# Patient Record
Sex: Male | Born: 1990 | Race: Black or African American | Hispanic: No | Marital: Single | Smoking: Never smoker
Health system: Southern US, Community
[De-identification: ages and names within clinical notes are randomized; demographics above are authoritative.]

## PROBLEM LIST (undated history)

## (undated) DIAGNOSIS — Z8619 Personal history of other infectious and parasitic diseases: Secondary | ICD-10-CM

---
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
## 2009-12-20 ENCOUNTER — Emergency Department (HOSPITAL_COMMUNITY): Admission: EM | Admit: 2009-12-20 | Discharge: 2009-12-20 | Payer: Self-pay | Admitting: Emergency Medicine

## 2011-05-19 ENCOUNTER — Ambulatory Visit (HOSPITAL_COMMUNITY): Payer: Self-pay | Admitting: Licensed Clinical Social Worker

## 2012-03-14 ENCOUNTER — Emergency Department (HOSPITAL_COMMUNITY)
Admission: EM | Admit: 2012-03-14 | Discharge: 2012-03-14 | Disposition: A | Discharge status: Home / Self Care | Payer: BC Managed Care – PPO | Attending: Emergency Medicine | Admitting: Emergency Medicine

## 2012-03-14 DIAGNOSIS — L738 Other specified follicular disorders: Secondary | ICD-10-CM | POA: Insufficient documentation

## 2012-03-14 DIAGNOSIS — L739 Follicular disorder, unspecified: Secondary | ICD-10-CM

## 2012-03-14 MED ORDER — DOXYCYCLINE HYCLATE 100 MG PO CAPS: 100.0000 mg | ORAL_CAPSULE | Freq: Two times a day (BID) | ORAL | Status: AC

## 2012-03-14 MED ORDER — HYDROCODONE-ACETAMINOPHEN 5-325 MG PO TABS: 2.0000 | ORAL_TABLET | ORAL | Status: AC | PRN

## 2012-03-14 MED ORDER — AMOXICILLIN-POT CLAVULANATE 500-125 MG PO TABS: 1.0000 | ORAL_TABLET | Freq: Three times a day (TID) | ORAL | Status: AC

## 2012-03-14 MED ORDER — IBUPROFEN 600 MG PO TABS: 600.0000 mg | ORAL_TABLET | Freq: Four times a day (QID) | ORAL | Status: AC | PRN

## 2012-03-14 NOTE — ED Provider Notes (Signed)
History   This chart was scribed for Nelia Shi, MD by Sofie Rower. The patient was seen in room TR07C/TR07C and the patient's care was started at 2:52 PM   CSN: [id number]  Arrival date & time 03/14/12  1332   First MD Initiated Contact with Patient 03/14/12 1424      Chief Complaint  Patient presents with  . Facial Pain    (Consider location/radiation/quality/duration/timing/severity/associated sxs/prior treatment) Patient is a 21 y.o. male presenting with facial injury. The history is provided by the patient. No language interpreter was used.  Facial Injury  The incident occurred yesterday. The incident occurred at home. Injury mechanism: Pulling ingrown hairs with tweezers. Context: While removing ingrown hairs. The wounds were self-inflicted. He came to the ER via personal transport. The pain is moderate. It is unknown if a foreign body is present. There is no possibility that he inhaled smoke. Pertinent negatives include no numbness, no visual disturbance, no nausea, no vomiting, no headaches, no neck pain, no focal weakness, no tingling and no cough. There have been no prior injuries to these areas.    Jimmy Lucas is a 21 y.o. male who presents to the Emergency Department complaining of sudden, progressively worsening, facial pain located at the right side of the face , onset two days ago,  with associated symptoms of swelling and redness located at the right side of the face. The pt reports he pulled some hairs from the right side of his beard with tweezers two days ago and woke up this morning with swelling located at the right side of his face. 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    No past medical history on file.  No past surgical history on file.  No family history on file.  History  Substance Use Topics  . Smoking status: Not on file  . Smokeless tobacco: Not  on file  . Alcohol Use: Not on file      Review of Systems  HENT: Negative for neck pain.   Eyes: Negative for visual disturbance.  Respiratory: Negative for cough.   Gastrointestinal: Negative for nausea and vomiting.  Neurological: Negative for tingling, focal weakness, numbness and headaches.  All other systems reviewed and are negative.    Allergies  Review of patient's allergies indicates no known allergies.  Home Medications   Current Outpatient Rx  Name Route Sig Dispense Refill  . AMOXICILLIN-POT CLAVULANATE 500-125 MG PO TABS Oral Take 1 tablet (500 mg total) by mouth every 8 (eight) hours. 21 tablet 0  . HYDROCODONE-ACETAMINOPHEN 5-325 MG PO TABS Oral Take 2 tablets by mouth every 4 (four) hours as needed for pain. 6 tablet 0  . IBUPROFEN 600 MG PO TABS Oral Take 1 tablet (600 mg total) by mouth every 6 (six) hours as needed for pain. 30 tablet 0    BP 128/84  Pulse 65  Temp 97.1 F (36.2 C)  Resp 16  SpO2 100%  Physical Exam  Nursing note and vitals reviewed. Constitutional: He is oriented to person, place, and time. He appears well-developed. No distress.  HENT:  Head: Normocephalic and atraumatic.  Eyes: Pupils are equal, round, and reactive to light.  Neck: Normal range of motion.    Cardiovascular: Normal rate and intact distal pulses.   Pulmonary/Chest: No respiratory distress.  Abdominal: Normal appearance. He exhibits no distension.  Musculoskeletal:  Normal range of motion.  Neurological: He is alert and oriented to person, place, and time. No cranial nerve deficit.  Skin: Skin is warm and dry. No rash noted.  Psychiatric: He has a normal mood and affect. His behavior is normal.    ED Course  Procedures (including critical care time)  DIAGNOSTIC STUDIES: Oxygen Saturation is 100% on room air, normal by my interpretation.    COORDINATION OF CARE:    2:55PM- Skin infection and refraining from pulling anymore hairs from the beard  discussed.    Labs Reviewed - No data to display No results found.   1. Folliculitis       MDM        I personally performed the services described in this documentation, which was scribed in my presence. The recorded information has been reviewed and considered.    Nelia Shi, MD 03/14/12 1520

## 2012-03-14 NOTE — ED Notes (Signed)
States 2 days ago he pulled some hairs from rt side of mouth w/ tweezers and today woke up w/ swelling and reddness in that area states did clean skin before he did that

## 2013-04-16 ENCOUNTER — Emergency Department (HOSPITAL_COMMUNITY)
Admission: EM | Admit: 2013-04-16 | Discharge: 2013-04-16 | Disposition: A | Discharge status: Home / Self Care | Payer: BC Managed Care – PPO | Attending: Emergency Medicine | Admitting: Emergency Medicine

## 2013-04-16 ENCOUNTER — Encounter (HOSPITAL_COMMUNITY): Payer: Self-pay | Admitting: *Deleted

## 2013-04-16 DIAGNOSIS — Y929 Unspecified place or not applicable: Secondary | ICD-10-CM | POA: Insufficient documentation

## 2013-04-16 DIAGNOSIS — F17200 Nicotine dependence, unspecified, uncomplicated: Secondary | ICD-10-CM | POA: Insufficient documentation

## 2013-04-16 DIAGNOSIS — IMO0002 Reserved for concepts with insufficient information to code with codable children: Secondary | ICD-10-CM | POA: Insufficient documentation

## 2013-04-16 DIAGNOSIS — Y9389 Activity, other specified: Secondary | ICD-10-CM | POA: Insufficient documentation

## 2013-04-16 DIAGNOSIS — T161XXA Foreign body in right ear, initial encounter: Secondary | ICD-10-CM

## 2013-04-16 DIAGNOSIS — T169XXA Foreign body in ear, unspecified ear, initial encounter: Secondary | ICD-10-CM | POA: Insufficient documentation

## 2013-04-16 MED ORDER — LIDOCAINE HCL (PF) 1 % IJ SOLN
INTRAMUSCULAR | Status: AC
  Administered 2013-04-16: 5 mL via OTIC
  Filled 2013-04-16: qty 5

## 2013-04-16 MED ORDER — CIPROFLOXACIN-DEXAMETHASONE 0.3-0.1 % OT SUSP: 4.0000 [drp] | Freq: Two times a day (BID) | OTIC | Status: DC

## 2013-04-16 MED ORDER — CIPROFLOXACIN-DEXAMETHASONE 0.3-0.1 % OT SUSP
4.0000 [drp] | Freq: Two times a day (BID) | OTIC | Status: DC
  Administered 2013-04-16: 4 [drp] via OTIC
  Filled 2013-04-16 (×3): qty 7.5

## 2013-04-16 NOTE — ED Provider Notes (Signed)
CSN: [id number]     Arrival date & time 04/16/13  0038 History   First MD Initiated Contact with Patient 04/16/13 0320     Chief Complaint  Patient presents with  . Foreign Body in Ear   (Consider location/radiation/quality/duration/timing/severity/associated sxs/prior Treatment) HPI 22 year old male presents to emergency room with complaint of bug in right ear.  Patient reports he's been feeling insect crawl around.  He is tried to take it out without improvement. History reviewed. No pertinent past medical history. History reviewed. No pertinent past surgical history. History reviewed. No pertinent family history. History  Substance Use Topics  . Smoking status: Current Every Day Smoker  . Smokeless tobacco: Not on file  . Alcohol Use: Yes    Review of Systems  All other systems reviewed and are negative.    Allergies  Review of patient's allergies indicates no known allergies.  Home Medications   Current Outpatient Rx  Name  Route  Sig  Dispense  Refill  . ciprofloxacin-dexamethasone (CIPRODEX) otic suspension   Right Ear   Place 4 drops into the right ear 2 (two) times daily.   7.5 mL   0    BP 139/82  Pulse 114  Temp(Src) 98.1 F (36.7 C) (Oral)  Resp 20  SpO2 100% Physical Exam  HENT:  Left Ear: External ear normal.  Right ear without insect visualized.  Excoriation noted to external pinna.  He has irritation of the canal, and the TM.    ED Course  Procedures (including critical care time) Labs Review Labs Reviewed - No data to display Imaging Review No results found.  MDM   1. FB ear, right, initial encounter    22 year old male with reported blood in the ear.  It was visualized at triage, but has since left the ear canal.  Given the excoriation and inflammation of the ear canal, will start on Cipro otic.    Olivia Mackie, MD 04/16/13 [phone]

## 2013-04-16 NOTE — ED Notes (Signed)
Lidocaine placed in ear to assist in removing cockroach.

## 2013-04-16 NOTE — ED Notes (Signed)
Pt has bug in his right ear confirmed by looking in right ear.

## 2014-01-01 ENCOUNTER — Emergency Department (HOSPITAL_COMMUNITY)
Admission: EM | Admit: 2014-01-01 | Discharge: 2014-01-02 | Disposition: A | Discharge status: Home / Self Care | Payer: BC Managed Care – PPO | Attending: Emergency Medicine | Admitting: Emergency Medicine

## 2014-01-01 ENCOUNTER — Encounter (HOSPITAL_COMMUNITY): Payer: Self-pay | Admitting: Emergency Medicine

## 2014-01-01 ENCOUNTER — Emergency Department (HOSPITAL_COMMUNITY): Payer: BC Managed Care – PPO

## 2014-01-01 DIAGNOSIS — R55 Syncope and collapse: Secondary | ICD-10-CM | POA: Insufficient documentation

## 2014-01-01 DIAGNOSIS — R079 Chest pain, unspecified: Secondary | ICD-10-CM | POA: Insufficient documentation

## 2014-01-01 DIAGNOSIS — F17200 Nicotine dependence, unspecified, uncomplicated: Secondary | ICD-10-CM | POA: Insufficient documentation

## 2014-01-01 LAB — CBG MONITORING, ED: Glucose-Capillary: 103 mg/dL — ABNORMAL HIGH (ref 70–99)

## 2014-01-01 LAB — CBC
HEMATOCRIT: 42.2 % (ref 39.0–52.0)
Hemoglobin: 14.9 g/dL (ref 13.0–17.0)
MCH: 26.7 pg (ref 26.0–34.0)
MCHC: 35.3 g/dL (ref 30.0–36.0)
MCV: 75.6 fL — ABNORMAL LOW (ref 78.0–100.0)
PLATELETS: 186 10*3/uL (ref 150–400)
RBC: 5.58 MIL/uL (ref 4.22–5.81)
RDW: 13 % (ref 11.5–15.5)
WBC: 7.3 10*3/uL (ref 4.0–10.5)

## 2014-01-01 LAB — I-STAT CHEM 8, ED
BUN: 12 mg/dL (ref 6–23)
CALCIUM ION: 1.17 mmol/L (ref 1.12–1.23)
Chloride: 100 mEq/L (ref 96–112)
Creatinine, Ser: 0.9 mg/dL (ref 0.50–1.35)
GLUCOSE: 93 mg/dL (ref 70–99)
HCT: 48 % (ref 39.0–52.0)
Hemoglobin: 16.3 g/dL (ref 13.0–17.0)
Potassium: 3.5 mEq/L — ABNORMAL LOW (ref 3.7–5.3)
Sodium: 139 mEq/L (ref 137–147)
TCO2: 21 mmol/L (ref 0–100)

## 2014-01-01 NOTE — ED Notes (Signed)
Pt states after playing basketball for 20ish minutes this evening began having chest discomfort, sat down began having numbness to hands pt stood to walk to vehicle for water feels his legs became weak and lost consciousness. Denies injury with fall.

## 2014-01-02 NOTE — ED Provider Notes (Signed)
CSN: [id number]     Arrival date & time 01/01/14  2116 History   First MD Initiated Contact with Patient 01/01/14 2306     Chief Complaint  Patient presents with  . Loss of Consciousness      HPI Patient presents the emergency department because of approximately 20 minutes of chest discomfort while playing basketball this evening.  He has not played basketball a while.  He is playing outdoors in the heat.  He states he began playing shortly after awakening without eating or drinking anything.  After he developed a chest discomfort he then walked towards the [place] to get something to drink and had an episode of syncope.  He states he felt weak and lost consciousness and fell to the ground.  He has no complaints at this time.  Denies chest pain.  His symptoms also began initially after he felt weak and he sat down and began having numbness in both of his hands and he was when he stood to walk to the vehicle that he loss consciousness.  No family history of early cardiac death.  No active chest pain at this time.  Patient does smoke cigarettes.  No family history of early heart disease.   History reviewed. No pertinent past medical history. History reviewed. No pertinent past surgical history. No family history on file. History  Substance Use Topics  . Smoking status: Current Every Day Smoker  . Smokeless tobacco: Not on file  . Alcohol Use: Yes    Review of Systems  All other systems reviewed and are negative.     Allergies  Review of patient's allergies indicates no known allergies.  Home Medications   Prior to Admission medications   Medication Sig Start Date End Date Taking? Authorizing Provider  OVER THE COUNTER MEDICATION Apply 1 application topically as needed (for razor bumps). Kiti Kiti over the counter cream   Yes Historical Provider, MD   BP 127/82  Pulse 96  Temp(Src) 98.4 F (36.9 C) (Oral)  Resp 20  Ht 6' (1.829 m)  Wt 155 lb (70.308 kg)  BMI 21.02 kg/m2  SpO2  100% Physical Exam  Nursing note and vitals reviewed. Constitutional: He is oriented to person, place, and time. He appears well-developed and well-nourished.  HENT:  Head: Normocephalic and atraumatic.  Eyes: EOM are normal.  Neck: Normal range of motion.  Cardiovascular: Normal rate, regular rhythm, normal heart sounds and intact distal pulses.   No murmur heard. Pulmonary/Chest: Effort normal and breath sounds normal. No respiratory distress.  Abdominal: Soft. He exhibits no distension. There is no tenderness.  Musculoskeletal: Normal range of motion.  Neurological: He is alert and oriented to person, place, and time.  Skin: Skin is warm and dry.  Psychiatric: He has a normal mood and affect. Judgment normal.    ED Course  Procedures (including critical care time) Labs Review Labs Reviewed  CBC - Abnormal; Notable for the following:    MCV 75.6 (*)    All other components within normal limits  CBG MONITORING, ED - Abnormal; Notable for the following:    Glucose-Capillary 103 (*)    All other components within normal limits  I-STAT CHEM 8, ED - Abnormal; Notable for the following:    Potassium 3.5 (*)    All other components within normal limits  CBG MONITORING, ED    Imaging Review Dg Chest 2 View  01/02/2014   CLINICAL DATA:  Syncope.  Cigarette smoker.  Lost consciousness.  EXAM: CHEST  2 VIEW  COMPARISON:  None.  FINDINGS: Cardiopericardial silhouette within normal limits. Mediastinal contours normal. Trachea midline. No airspace disease or effusion. Monitoring leads project over the chest.  IMPRESSION: No active cardiopulmonary disease.   Electronically Signed   By: Andreas NewportGeoffrey  Lamke M.D.   On: 01/02/2014 00:26  I personally reviewed the imaging tests through PACS system I reviewed available ER/hospitalization records through the EMR    EKG Interpretation   Date/Time:  Wednesday January 01 2014 21:39:52 EDT Ventricular Rate:  93 PR Interval:  150 QRS Duration: 89 QT  Interval:  338 QTC Calculation: 420 R Axis:   70 Text Interpretation:  Sinus rhythm ST elevation suggests acute  pericarditis No old tracing to compare Confirmed by CAMPOS  MD, Caryn BeeKEVIN  ([id number]) on 01/01/2014 11:28:08 PM      MDM   Final diagnoses:  Chest pain  Syncope    Early repolarization on his EKG.  Patient was kept on the monitor.  No arrhythmias were noted.  I think this is more likely related to orthostatic hypotension and volume depletion.  However given that this occurred during exertion I think it is reasonable to get an outpatient echocardiogram.  The patient will be referred to cardiology for this.  I spoke with the patient and the patient's family at length.  All questions have been answered.  Patient understands return to the ER for new or worsening symptoms.    Lyanne CoKevin M Campos, MD 01/02/14 [phone]

## 2014-01-02 NOTE — Discharge Instructions (Signed)

## 2015-02-21 ENCOUNTER — Emergency Department (HOSPITAL_COMMUNITY)
Admission: EM | Admit: 2015-02-21 | Discharge: 2015-02-21 | Disposition: A | Discharge status: Home / Self Care | Payer: Self-pay | Attending: Emergency Medicine | Admitting: Emergency Medicine

## 2015-02-21 ENCOUNTER — Encounter (HOSPITAL_COMMUNITY): Payer: Self-pay | Admitting: Family Medicine

## 2015-02-21 DIAGNOSIS — L03221 Cellulitis of neck: Secondary | ICD-10-CM | POA: Insufficient documentation

## 2015-02-21 DIAGNOSIS — Z720 Tobacco use: Secondary | ICD-10-CM | POA: Insufficient documentation

## 2015-02-21 DIAGNOSIS — IMO0002 Reserved for concepts with insufficient information to code with codable children: Secondary | ICD-10-CM

## 2015-02-21 MED ORDER — SULFAMETHOXAZOLE-TRIMETHOPRIM 800-160 MG PO TABS
1.0000 | ORAL_TABLET | Freq: Once | ORAL | Status: AC
  Administered 2015-02-21: 1 via ORAL
  Filled 2015-02-21: qty 1

## 2015-02-21 MED ORDER — SULFAMETHOXAZOLE-TRIMETHOPRIM 800-160 MG PO TABS: 1.0000 | ORAL_TABLET | Freq: Two times a day (BID) | ORAL | Status: AC

## 2015-02-21 NOTE — Discharge Instructions (Signed)
Abscess Apply warm compresses to the area several times a day. Take anabiotic says prescribed. Follow-up with your primary care physician using the resource guide below. An abscess is an infected area that contains a collection of pus and debris.It can occur in almost any part of the body. An abscess is also known as a furuncle or boil. CAUSES  An abscess occurs when tissue gets infected. This can occur from blockage of oil or sweat glands, infection of hair follicles, or a minor injury to the skin. As the body tries to fight the infection, pus collects in the area and creates pressure under the skin. This pressure causes pain. People with weakened immune systems have difficulty fighting infections and get certain abscesses more often.  SYMPTOMS Usually an abscess develops on the skin and becomes a painful mass that is red, warm, and tender. If the abscess forms under the skin, you may feel a moveable soft area under the skin. Some abscesses break open (rupture) on their own, but most will continue to get worse without care. The infection can spread deeper into the body and eventually into the bloodstream, causing you to feel ill.  DIAGNOSIS  Your caregiver will take your medical history and perform a physical exam. A sample of fluid may also be taken from the abscess to determine what is causing your infection. TREATMENT  Your caregiver may prescribe antibiotic medicines to fight the infection. However, taking antibiotics alone usually does not cure an abscess. Your caregiver may need to make a small cut (incision) in the abscess to drain the pus. In some cases, gauze is packed into the abscess to reduce pain and to continue draining the area. HOME CARE INSTRUCTIONS   Only take over-the-counter or prescription medicines for pain, discomfort, or fever as directed by your caregiver.  If you were prescribed antibiotics, take them as directed. Finish them even if you start to feel better.  If gauze is  used, follow your caregiver's directions for changing the gauze.  To avoid spreading the infection:  Keep your draining abscess covered with a bandage.  Wash your hands well.  Do not share personal care items, towels, or whirlpools with others.  Avoid skin contact with others.  Keep your skin and clothes clean around the abscess.  Keep all follow-up appointments as directed by your caregiver. SEEK MEDICAL CARE IF:   You have increased pain, swelling, redness, fluid drainage, or bleeding.  You have muscle aches, chills, or a general ill feeling.  You have a fever. MAKE SURE YOU:   Understand these instructions.  Will watch your condition.  Will get help right away if you are not doing well or get worse. Document Released: 04/13/2005 Document Revised: 01/03/2012 Document Reviewed: 09/16/2011 [facility] Patient Information 2015 [place], [place]. This information is not intended to replace advice given to you by your health care provider. Make sure you discuss any questions you have with your health care provider.

## 2015-02-21 NOTE — ED Provider Notes (Signed)
CSN: [id number]     Arrival date & time 02/21/15  1825 History  This chart was scribed for Catha Gosselin, working with Elwin Mocha, MD by Elon Spanner, ED Scribe. This patient was seen in room TR06C/TR06C and the patient's care was started at 6:34 PM.    Chief Complaint  Patient presents with  . Abscess   The history is provided by the patient. No language interpreter was used.   HPI Comments: Jimmy Lucas is a 24 y.o. male who presents to the Emergency Department complaining of a gradually worsening bump on the back of his neck.  The patient reports a history of staph infection on his face three years ago that was treated with antibiotics and ultimately drained on its own.  The patient suspects both of these episodes stem from working out in a dirty gym.  He has used a warm compress for this complaint without relief.  He denies any fever, chills.  History reviewed. No pertinent past medical history. History reviewed. No pertinent past surgical history. History reviewed. No pertinent family history. History  Substance Use Topics  . Smoking status: Current Every Day Smoker  . Smokeless tobacco: Not on file  . Alcohol Use: Yes    Review of Systems  Constitutional: Negative for fever.  Skin: Positive for wound.      Allergies  Review of patient's allergies indicates no known allergies.  Home Medications   Prior to Admission medications   Medication Sig Start Date End Date Taking? Authorizing Provider  OVER THE COUNTER MEDICATION Apply 1 application topically as needed (for razor bumps). Kiti Kiti over the counter cream    Historical Provider, MD  sulfamethoxazole-trimethoprim (BACTRIM DS,SEPTRA DS) 800-160 MG per tablet Take 1 tablet by mouth 2 (two) times daily. 02/21/15 02/28/15  Kazia Grisanti Patel-Mills, PA-C   BP 126/65 mmHg  Pulse 99  Temp(Src) 98 F (36.7 C)  Resp 18  SpO2 98% Physical Exam  Constitutional: He is oriented to person, place, and time. He appears  well-developed and well-nourished. No distress.  HENT:  Head: Normocephalic and atraumatic.  Eyes: Conjunctivae and EOM are normal.  Neck: Neck supple. No tracheal deviation present.  Cardiovascular: Normal rate.   Pulmonary/Chest: Effort normal. No respiratory distress.  Musculoskeletal: Normal range of motion.  Neurological: He is alert and oriented to person, place, and time.  Skin: Skin is warm and dry.  1 cm erythematous area on the posterior right side of his neck.  No fluctuance or drainage.  No surrounding edema.  No posterior cervical lymphadenopathy.   Psychiatric: He has a normal mood and affect. His behavior is normal.  Nursing note and vitals reviewed.   ED Course  Procedures (including critical care time)  DIAGNOSTIC STUDIES: Oxygen Saturation is 98% on RA, normal by my interpretation.    COORDINATION OF CARE:  6:37 PM Discussed treatment plan with patient at bedside.  Patient acknowledges and agrees with plan.    Labs Review Labs Reviewed - No data to display  Imaging Review No results found.   EKG Interpretation None      MDM   Final diagnoses:  Abscess or cellulitis, neck   Patient presents for abscess on the right side of the posterior neck. There is no fluctuance or drainage to the abscess. The abscess is not big enough to do an I&D and patient is worried he may have a staph infection since he has had this previously. I discussed applying warm compresses to the area several times  a day and placed him on Bactrim. Patient verbally agrees with the plan and there are no unanswered questions or concerns. I personally performed the services described in this documentation, which was scribed in my presence. The recorded information has been reviewed and is accurate.    Catha Gosselin, PA-C 02/21/15 1846  Elwin Mocha, MD 02/21/15 [phone]

## 2015-02-21 NOTE — ED Notes (Signed)
Pt here for abscess to back of neck. sts similar in the past with staff infection.

## 2016-11-25 ENCOUNTER — Emergency Department (HOSPITAL_COMMUNITY): Payer: Self-pay

## 2016-11-25 ENCOUNTER — Encounter (HOSPITAL_COMMUNITY): Payer: Self-pay

## 2016-11-25 ENCOUNTER — Emergency Department (HOSPITAL_COMMUNITY)
Admission: EM | Admit: 2016-11-25 | Discharge: 2016-11-25 | Disposition: A | Discharge status: Home / Self Care | Payer: Self-pay | Attending: Emergency Medicine | Admitting: Emergency Medicine

## 2016-11-25 DIAGNOSIS — F17200 Nicotine dependence, unspecified, uncomplicated: Secondary | ICD-10-CM | POA: Insufficient documentation

## 2016-11-25 DIAGNOSIS — Y939 Activity, unspecified: Secondary | ICD-10-CM | POA: Insufficient documentation

## 2016-11-25 DIAGNOSIS — Z79899 Other long term (current) drug therapy: Secondary | ICD-10-CM | POA: Insufficient documentation

## 2016-11-25 DIAGNOSIS — R220 Localized swelling, mass and lump, head: Secondary | ICD-10-CM | POA: Insufficient documentation

## 2016-11-25 DIAGNOSIS — Y929 Unspecified place or not applicable: Secondary | ICD-10-CM | POA: Insufficient documentation

## 2016-11-25 DIAGNOSIS — X509XXA Other and unspecified overexertion or strenuous movements or postures, initial encounter: Secondary | ICD-10-CM | POA: Insufficient documentation

## 2016-11-25 DIAGNOSIS — M25511 Pain in right shoulder: Secondary | ICD-10-CM | POA: Insufficient documentation

## 2016-11-25 DIAGNOSIS — Y999 Unspecified external cause status: Secondary | ICD-10-CM | POA: Insufficient documentation

## 2016-11-25 HISTORY — DX: Personal history of other infectious and parasitic diseases: Z86.19

## 2016-11-25 MED ORDER — IBUPROFEN 600 MG PO TABS: 600.0000 mg | ORAL_TABLET | Freq: Four times a day (QID) | ORAL | 0 refills | Status: DC | PRN

## 2016-11-25 NOTE — ED Triage Notes (Addendum)
Pt reports right sided shoulder pain since last week and left cheek swelling. Pt reports hx of staph infection in left cheek. Pt A+OX4, speaking in complete sentences, airway patent, ambulatory to triage.

## 2016-11-25 NOTE — Discharge Instructions (Signed)
Shoulder Pain: You have been seen today for shoulder pain. There were no acute abnormalities on the x-rays, including no sign of fracture or dislocation. Pain: Take 600 mg of ibuprofen every 6 hours or 440 mg (2 tablets) of naproxen every 12 hours for the next three days. May take ibuprofen or naproxen as needed after this time to reduce pain and inflammation. Take these types of medications with food to avoid upset stomach. Choose one of these medications, but not both.  Ice: May apply ice to the area over the next 24 hours for 15 minutes at a time to reduce swelling. Sling: Wear the sling for support and comfort. Wear this until pain resolves. Exercises: Start by performing these exercises a few times a week, increasing the frequency until you are performing them twice daily.  Follow up: Follow up with the orthopedic specialist. Call the number provided to set up an appointment.  Facial swelling: Apply warm, moist compresses twice daily. Massage the area afterward. Follow up with a primary care provider for any further management of this issue. Return to the ED should symptoms worsen.

## 2016-11-25 NOTE — ED Notes (Signed)
Discharge instructions, follow up care, and rx x1 reviewed with patient. Patient verbalized understanding. 

## 2016-11-25 NOTE — ED Notes (Signed)
ED Provider at bedside. 

## 2016-11-25 NOTE — ED Provider Notes (Signed)
[facility]-EMERGENCY DEPT Provider Note   CSN: [id number] Arrival date & time: 11/25/16  0604     History   Chief Complaint Chief Complaint  Patient presents with  . Shoulder Pain  . Facial Swelling    HPI Jimmy Lucas is a 26 y.o. male.  HPI    Jimmy Lucas is a 26 y.o. male, with a history of Folliculitis, presenting to the ED with 2 separate complaints of right shoulder pain and left facial swelling. Right shoulder pain began a few days ago after a workout. Patient states he had difficulty moving the shoulder, felt a pop, and his pain significantly improved and he again had full range of motion. Current pain is minor, aching, nonradiating. Denies known specific injury mechanism or neuro deficits. Patient noticed left-sided facial swelling that began this morning. He has not experienced this before. He applied Neosporin prior to arrival. He denies associated pain, N/V, fever, or any other related complaints.     Past Medical History:  Diagnosis Date  . History of staph infection     There are no active problems to display for this patient.   History reviewed. No pertinent surgical history.     Home Medications    Prior to Admission medications   Medication Sig Start Date End Date Taking? Authorizing Provider  ibuprofen (ADVIL,MOTRIN) 600 MG tablet Take 1 tablet (600 mg total) by mouth every 6 (six) hours as needed. 11/25/16   Alleigh Mollica C, PA-C  OVER THE COUNTER MEDICATION Apply 1 application topically as needed (for razor bumps). Kiti Kiti over the counter cream    [provider]    Family History History reviewed. No pertinent family history.  Social History Social History  Substance Use Topics  . Smoking status: Current Every Day Smoker  . Smokeless tobacco: Never Used  . Alcohol use Yes     Allergies   Patient has no known allergies.   Review of Systems Review of Systems  Constitutional: Negative for chills and fever.  HENT: Positive  for facial swelling. Negative for trouble swallowing.   Respiratory: Negative for cough and shortness of breath.   Gastrointestinal: Negative for nausea and vomiting.  Musculoskeletal: Positive for arthralgias. Negative for joint swelling.  Skin: Negative for rash.     Physical Exam Updated Vital Signs BP 119/74 (BP Location: Left Arm)   Pulse 70   Temp 97.7 F (36.5 C) (Oral)   Resp 16   Ht 6' (1.829 m)   Wt 77.3 kg   SpO2 100%   BMI 23.12 kg/m   Physical Exam  Constitutional: He appears well-developed and well-nourished. No distress.  HENT:  Head: Normocephalic and atraumatic.  Mouth/Throat: Oropharynx is clear and moist.  Small, firm, nontender, mobile apparent subcutaneous mass at the left corner of the mouth. Handles oral secretions without difficulty. No surrounding erythema or rash. No difficulty opening or closing mouth. No intraoral lesions or abnormalities noted. Dentition appears intact. No swelling or tenderness to gingival or buccal surfaces.  Eyes: Conjunctivae are normal.  Neck: Normal range of motion. Neck supple.  Cardiovascular: Normal rate, regular rhythm and intact distal pulses.   Pulmonary/Chest: Effort normal.  Musculoskeletal: Normal range of motion. He exhibits tenderness. He exhibits no edema.  Tenderness to the anterior right shoulder. Full passive and active range of motion without noted deformity or swelling. Slight crepitus noted.   Lymphadenopathy:    He has no cervical adenopathy.  Neurological: He is alert.  Strength in bilateral upper  extremities, especially the shoulders, 5 out of 5 and equal. No noted sensory deficits.  Skin: Skin is warm and dry. Capillary refill takes less than 2 seconds. No rash noted. He is not diaphoretic.  Psychiatric: He has a normal mood and affect. His behavior is normal.  Nursing note and vitals reviewed.    ED Treatments / Results  Labs (all labs ordered are listed, but only abnormal results are  displayed) Labs Reviewed - No data to display  EKG  EKG Interpretation None       Radiology Dg Shoulder Right  Result Date: 11/25/2016 CLINICAL DATA:  Worsening rt shoulder pain, popping sensation since last week, states 1st injured approx 46yr ago lifting weights and still lifts approx 2-3 times per week EXAM: RIGHT SHOULDER - 2+ VIEW COMPARISON:  None. FINDINGS: No fracture.  No bone lesion. The glenohumeral and AC joints are normally spaced and aligned. No arthropathic change. Normal soft tissues. IMPRESSION: Negative. Electronically Signed   By: Amie Portland M.D.   On: 11/25/2016 08:17    Procedures Procedures (including critical care time)  Medications Ordered in ED Medications - No data to display   Initial Impression / Assessment and Plan / ED Course  I have reviewed the triage vital signs and the nursing notes.  Pertinent labs & imaging results that were available during my care of the patient were reviewed by me and considered in my medical decision making (see chart for details).      Patient presents with right shoulder pain and an area of left facial swelling. No acute abnormalities on right shoulder x-ray. Orthopedic follow-up. Sling for comfort. ROM exercises discussed. Left facial swelling is non-tender. Low suspicon for infectious source at this time. Conservative management, PCP follow-up, and return precautions discussed. Patient voices understanding of all instructions and is comfortable with discharge.     Final Clinical Impressions(s) / ED Diagnoses   Final diagnoses:  Acute pain of right shoulder  Facial swelling    New Prescriptions New Prescriptions   IBUPROFEN (ADVIL,MOTRIN) 600 MG TABLET    Take 1 tablet (600 mg total) by mouth every 6 (six) hours as needed.     Anselm Pancoast, PA-C 11/25/16 0981    Dione Booze, MD 11/25/16 [phone]

## 2016-11-25 NOTE — ED Notes (Signed)
Patient transported to X-ray 

## 2017-02-08 NOTE — ED Provider Notes (Signed)
 [facility]  ED Provider Note  Jimmy Lucas 26 y.o. male DOB: [birth year] MRN: [id number] History   Chief Complaint  Patient presents with  . Dental Pain    right sided   Previously evaluated by his dentist. He's having his wisdom teeth extracted in 3 days. Today he's having aching pain that is transiently relieved with motrin .     History provided by:  Patient Dental Pain  Location:  Upper Upper teeth location:  16/LU 3rd molar, 15/LU 2nd molar, 14/LU 1st molar, 3/RU 1st molar, 2/RU 2nd molar and 1/RU 3rd molar Quality:  Dull Severity:  Mild Onset quality:  Gradual Timing:  Constant Progression:  Unchanged Chronicity:  Recurrent Relieved by:  Nothing Worsened by:  Nothing tried Ineffective treatments:  None tried Associated symptoms: no facial swelling, no fever and no oral lesions     History reviewed. No pertinent past medical history.  History reviewed. No pertinent surgical history.  History  Alcohol Use  . Yes    Comment: occ   History  Smoking Status  . Never Smoker  Smokeless Tobacco  . Never Used   History  Drug Use  . Types: Marijuana   Tetanus up to date?: Yes Immunizations Up to Date?: Yes  No Known Allergies  Home Medications   No medications on file    Review of Systems   Review of Systems  Constitutional: Negative for fever.  HENT: Positive for dental problem. Negative for ear pain, facial swelling, mouth sores and sore throat.   Hematological: Negative for adenopathy.  All other systems reviewed and are negative.   Physical Exam   ED Triage Vitals [02/08/17 2119]  BP (!) 138/91  Heart Rate 60  Resp 18  SpO2 98 %  Temp 97.4 F (36.3 C)    Physical Exam  Nursing note and vitals reviewed. Constitutional: He appears well-developed and well-nourished.  HENT:  Head: Normocephalic.  Mouth/Throat: Mucous membranes are normal. No dental tenderness, oral lesions or sublingual induration. No  dental abscesses or dental caries.   No trismus not present in the jaw. Voice normal.  Eyes: EOM are intact. Pupils are equal, round, and reactive to light.  Neck: Normal range of motion and voice normal.  Cardiovascular: Normal rate.  Pulmonary/Chest: Respiratory effort normal.  Lymphadenopathy:    No cervical adenopathy.  Neurological: He is alert and oriented to person, place, and time. He has normal speech.  Skin: Skin is warm. Skin is dry.  Psychiatric: His behavior is normal.    ED Course   Lab results:  No data to display  Imaging: No data to display ECG: ECG Results   None     Pre-Sedation Procedures    MDM Number of Diagnoses or Management Options Pain, dental: minor Patient Progress Patient progress: stable  MDM Reviewed: previous chart, nursing note and vitals    New Prescriptions   IBUPROFEN  (ADVIL ,MOTRIN ) 600 MG TABLET    Take one tablet (600 mg total) by mouth every 8 (eight) hours as needed for Pain.      Quantity: 20 tablet    Refills: 0   TRAMADOL (ULTRAM) 50 MG TABLET    Take one tablet (50 mg total) by mouth every 6 (six) hours as needed for Pain. This medication causes drowsiness. Do not drive or participate in any dangerous activities while using this medication. Do not mix this medication with any other sedative medications.   This medication can cause constipation. Drink plenty of water, eat  a high fiber diet and take a fiber supplement.      Quantity: 10 tablet    Refills: 0    Modified Medications   No medications on file    Discontinued Medications   No medications on file    Clinical Impression   Final diagnoses:  Pain, dental    ED Disposition    ED Disposition Comment   Discharge               Follow-up Information    Please follow up.   Contact information: Your dentist, friday           Electronically signed by:   Franky Free, PA-C 02/08/17 2124

## 2018-12-03 IMAGING — CR DG SHOULDER 2+V*R*
3 series · 3 of 3 positions shown · non-contrast
Comparison: None.

CLINICAL DATA: Worsening rt shoulder pain, popping sensation since
last week, states 1st injured approx 1yr ago lifting weights and
still lifts approx 2-3 times per week

EXAM:
RIGHT SHOULDER - 2+ VIEW

[w shoulder external right]
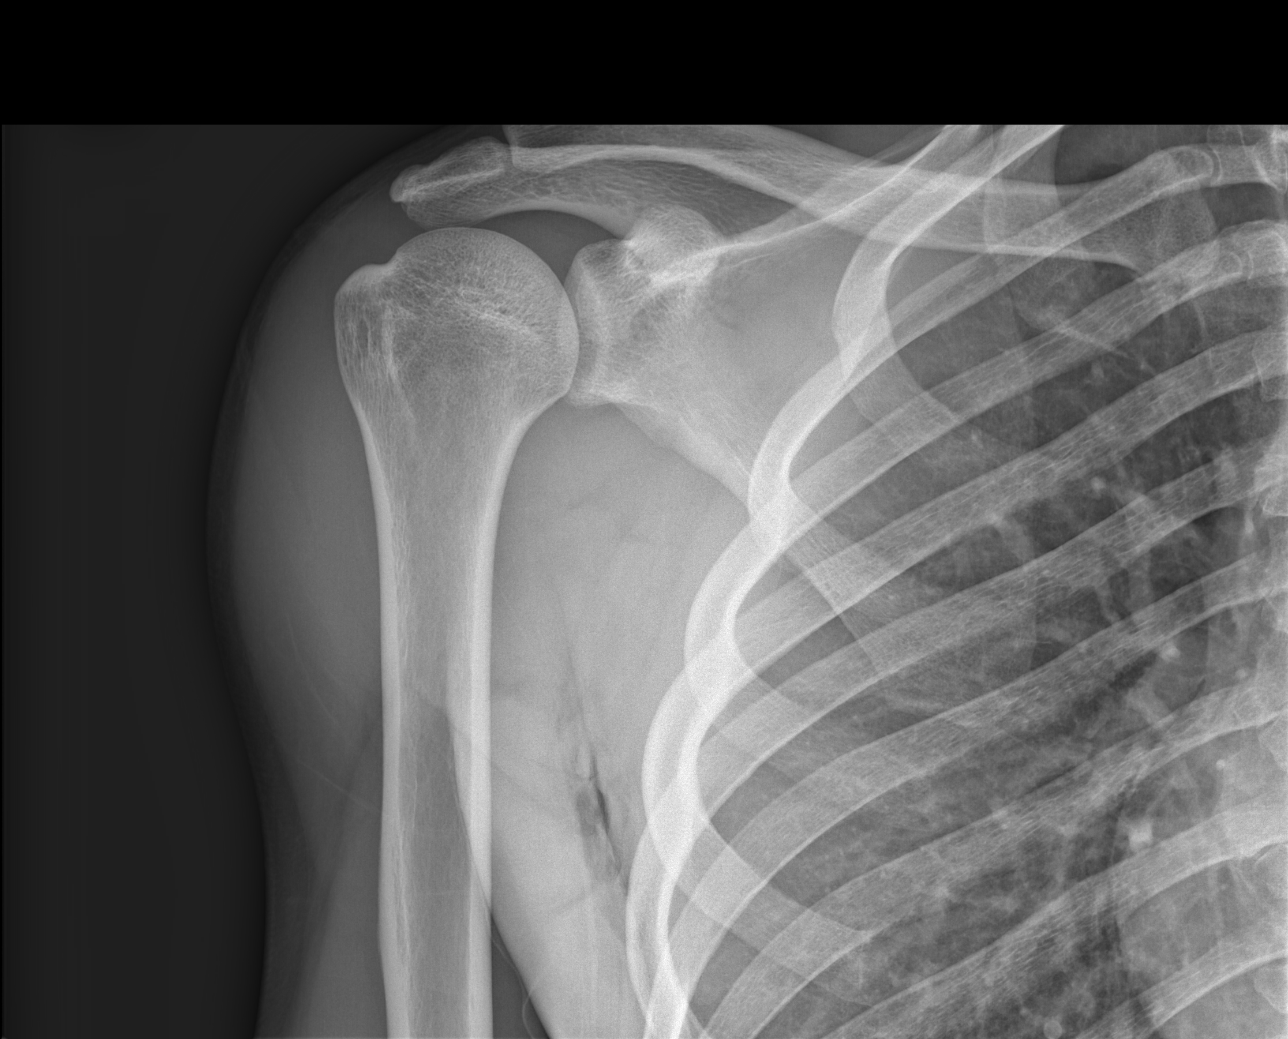

[w shoulder y-view right]
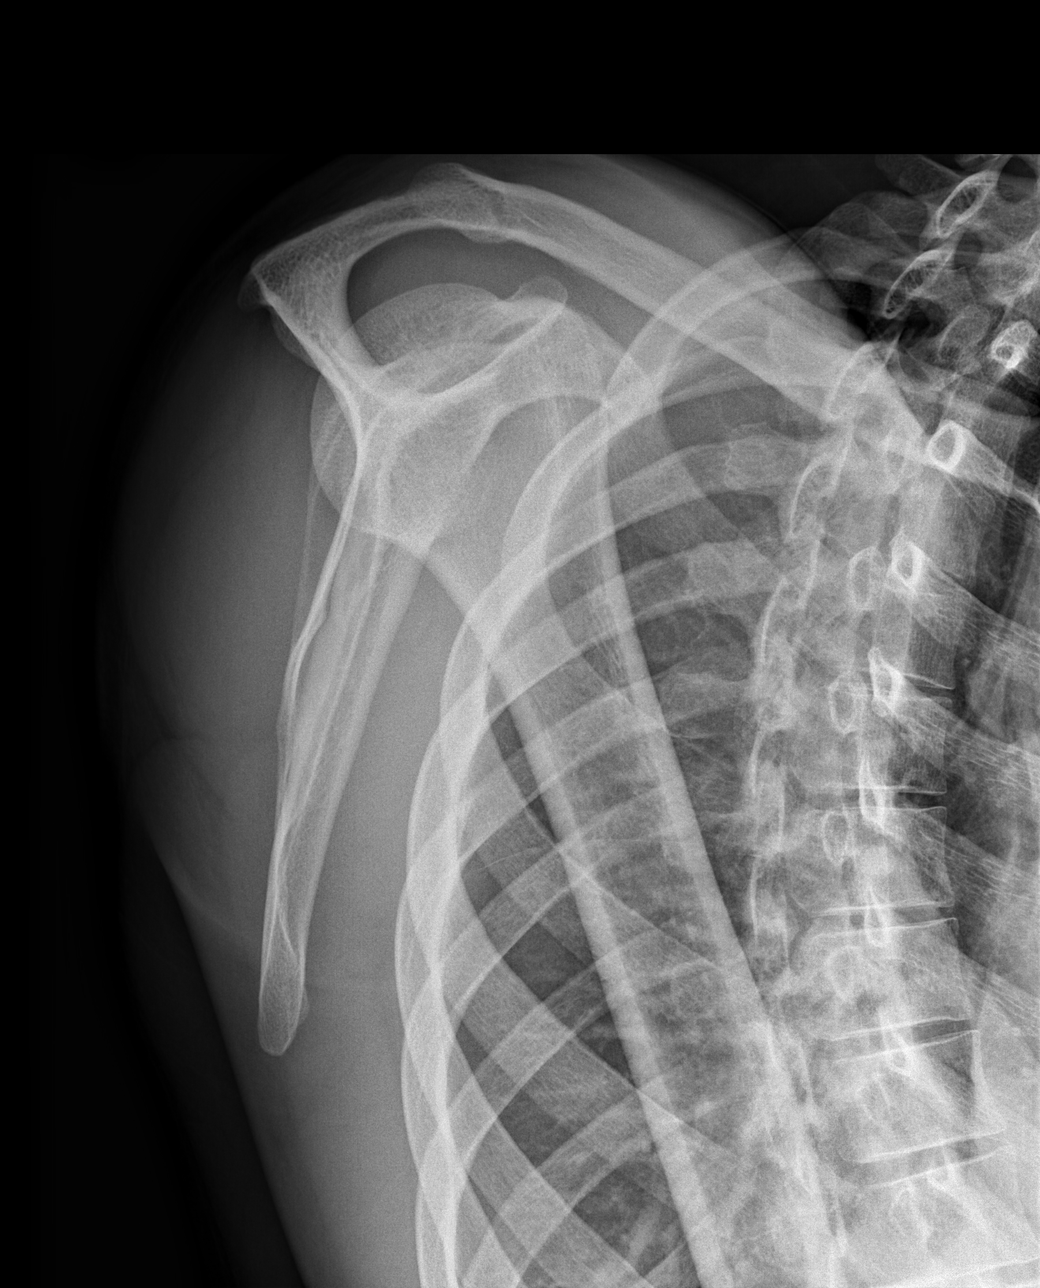

[x shoulder axillary right]
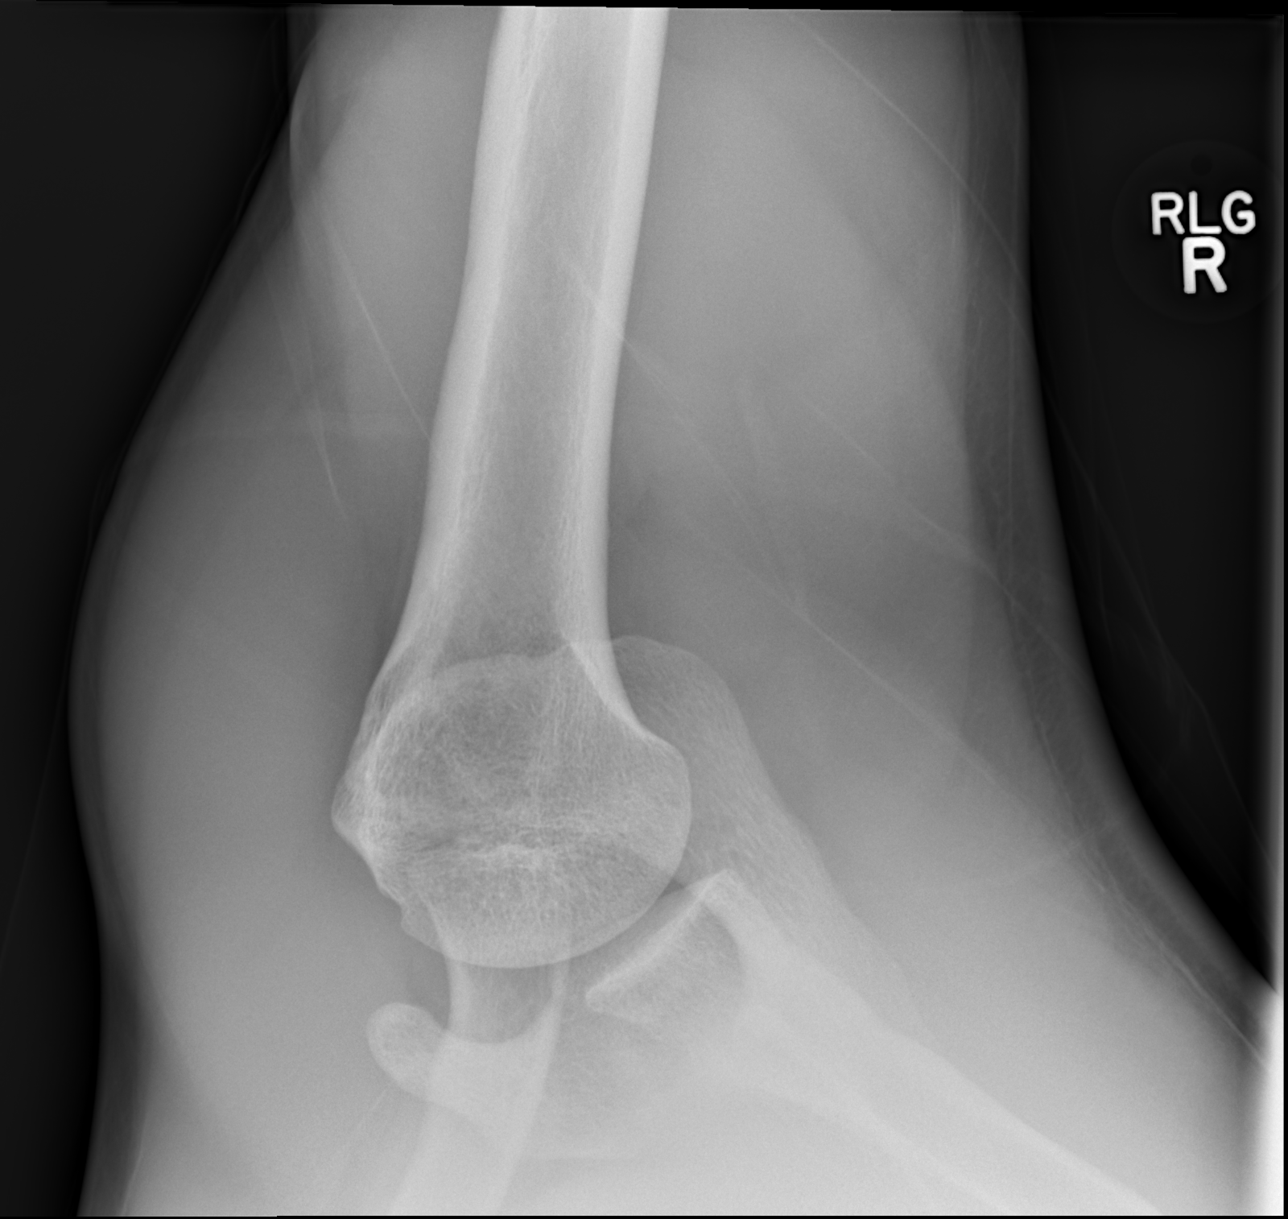

[3 of 3 positions shown; findings below may reference images not displayed]

FINDINGS: No fracture.  No bone lesion.

The glenohumeral and AC joints are normally spaced and aligned. No
arthropathic change.

Normal soft tissues.
IMPRESSION: Negative.

## 2019-01-01 ENCOUNTER — Emergency Department (HOSPITAL_COMMUNITY)
Admission: EM | Admit: 2019-01-01 | Discharge: 2019-01-01 | Disposition: A | Discharge status: Home / Self Care | Payer: Self-pay | Attending: Emergency Medicine | Admitting: Emergency Medicine

## 2019-01-01 ENCOUNTER — Encounter (HOSPITAL_COMMUNITY): Payer: Self-pay | Admitting: Emergency Medicine

## 2019-01-01 ENCOUNTER — Other Ambulatory Visit: Payer: Self-pay

## 2019-01-01 DIAGNOSIS — Y999 Unspecified external cause status: Secondary | ICD-10-CM | POA: Insufficient documentation

## 2019-01-01 DIAGNOSIS — L738 Other specified follicular disorders: Secondary | ICD-10-CM | POA: Insufficient documentation

## 2019-01-01 DIAGNOSIS — W208XXA Other cause of strike by thrown, projected or falling object, initial encounter: Secondary | ICD-10-CM | POA: Insufficient documentation

## 2019-01-01 DIAGNOSIS — Y9389 Activity, other specified: Secondary | ICD-10-CM | POA: Insufficient documentation

## 2019-01-01 DIAGNOSIS — Y92008 Other place in unspecified non-institutional (private) residence as the place of occurrence of the external cause: Secondary | ICD-10-CM | POA: Insufficient documentation

## 2019-01-01 MED ORDER — AMOXICILLIN-POT CLAVULANATE 875-125 MG PO TABS: 1.0000 | ORAL_TABLET | Freq: Two times a day (BID) | ORAL | 0 refills | Status: DC

## 2019-01-01 NOTE — ED Provider Notes (Signed)
[facility] EMERGENCY DEPARTMENT Provider Note   CSN: [id number] Arrival date & time: 01/01/19  1204     History   Chief Complaint Chief Complaint  Patient presents with  . hit head    HPI Jimmy Lucas is a 28 y.o. male.     HPI  He complains of a sore in his scalp and neck, for about a week.  He is concerned because he hit his head without pipe while working in her garage about 9 days ago.  He denies nausea, vomiting, fever, chills, weakness or dizziness.  He is currently employed but did not go to work today because of the discomfort.  There are no other known modifying factors.  Past Medical History:  Diagnosis Date  . History of staph infection     There are no active problems to display for this patient.   History reviewed. No pertinent surgical history.      Home Medications    Prior to Admission medications   Medication Sig Start Date End Date Taking? Authorizing Provider  ibuprofen (ADVIL,MOTRIN) 600 MG tablet Take 1 tablet (600 mg total) by mouth every 6 (six) hours as needed. 11/25/16   Joy, Shawn C, PA-C  OVER THE COUNTER MEDICATION Apply 1 application topically as needed (for razor bumps). Kiti Kiti over the counter cream    [provider]    Family History No family history on file.  Social History Social History   Tobacco Use  . Smoking status: Current Every Day Smoker  . Smokeless tobacco: Never Used  Substance Use Topics  . Alcohol use: Yes  . Drug use: Yes    Types: Marijuana     Allergies   Patient has no known allergies.   Review of Systems Review of Systems  All other systems reviewed and are negative.    Physical Exam Updated Vital Signs There were no vitals taken for this visit.  Physical Exam Vitals signs and nursing note reviewed.  Constitutional:      General: He is not in acute distress.    Appearance: He is well-developed. He is not ill-appearing, toxic-appearing or diaphoretic.   HENT:     Head: Normocephalic and atraumatic.     Comments: Left scalp with area of induration, swelling, and pustule, approximately 0.5 cm in diameter, left parietal region.  Associated minor adenopathy affix epidural region left upper neck.    Right Ear: External ear normal.     Left Ear: External ear normal.  Eyes:     Conjunctiva/sclera: Conjunctivae normal.     Pupils: Pupils are equal, round, and reactive to light.  Neck:     Musculoskeletal: Normal range of motion and neck supple.     Trachea: Phonation normal.  Cardiovascular:     Rate and Rhythm: Normal rate.  Pulmonary:     Effort: Pulmonary effort is normal.  Musculoskeletal: Normal range of motion.  Skin:    General: Skin is warm and dry.  Neurological:     Mental Status: He is alert and oriented to person, place, and time.     Cranial Nerves: No cranial nerve deficit.     Sensory: No sensory deficit.     Motor: No abnormal muscle tone.     Coordination: Coordination normal.  Psychiatric:        Mood and Affect: Mood normal.        Behavior: Behavior normal.        Thought Content: Thought content normal.  Judgment: Judgment normal.      ED Treatments / Results  Labs (all labs ordered are listed, but only abnormal results are displayed) Labs Reviewed - No data to display  EKG    Radiology No results found.  Procedures Procedures (including critical care time)  Medications Ordered in ED Medications - No data to display   Initial Impression / Assessment and Plan / ED Course  I have reviewed the triage vital signs and the nursing notes.  Pertinent labs & imaging results that were available during my care of the patient were reviewed by me and considered in my medical decision making (see chart for details).         No data found.  12:32 PM Reevaluation with update and discussion. After initial assessment and treatment, an updated evaluation reveals he is comfortable has no further  complaints.  Findings discussed and questions answered. Mancel BaleElliott Theron [place]   Medical Decision Making: Evaluation consistent with colitis of the scalp.  Doubt serious intracranial injury from head trauma about 9 days ago.  Systemic infection.  Doubt metabolic instability.  CRITICAL CARE-no Performed by: Mancel BaleElliott Anastassia Noack   Nursing Notes Reviewed/ Care Coordinated Applicable Imaging Reviewed Interpretation of Laboratory Data incorporated into ED treatment  The patient appears reasonably screened and/or stabilized for discharge and I doubt any other medical condition or other [facility] requiring further screening, evaluation, or treatment in the ED at this time prior to discharge.  Plan: Home Medications-OTC analgesia of choice; Home Treatments-warm compresses to affected area; return here if the recommended treatment, does not improve the symptoms; Recommended follow up-PCP, PRN     Final Clinical Impressions(s) / ED Diagnoses   Final diagnoses:  None    ED Discharge Orders    None       Mancel BaleWentz, Fadia Marlar, MD 01/01/19 1233

## 2019-01-01 NOTE — ED Triage Notes (Signed)
Pt was working in his shed and a pipe fell down and hit him on the back left of his head. Denies LOC, NV. Pt didn't take anything for pain.

## 2019-01-01 NOTE — ED Notes (Signed)
Pt given dc instructions pt verbalizes understanding.  

## 2019-01-01 NOTE — Discharge Instructions (Signed)
Soreness in your head, and neck seem to be related to a bacterial infection caused folliculitis in the left part of your scalp.  To treat this we are prescribing an antibiotic.  Also, use a warm compress on the sore area 3 or 4 times a day.  For pain take ibuprofen or acetaminophen.  Follow-up with your doctor of your choice as needed for problems.

## 2019-07-07 ENCOUNTER — Other Ambulatory Visit: Payer: Self-pay

## 2019-07-07 ENCOUNTER — Ambulatory Visit (HOSPITAL_COMMUNITY)
Admission: EM | Admit: 2019-07-07 | Discharge: 2019-07-07 | Disposition: A | Discharge status: Home / Self Care | Payer: Self-pay | Attending: Urgent Care | Admitting: Urgent Care

## 2019-07-07 ENCOUNTER — Encounter (HOSPITAL_COMMUNITY): Payer: Self-pay | Admitting: *Deleted

## 2019-07-07 DIAGNOSIS — S76212A Strain of adductor muscle, fascia and tendon of left thigh, initial encounter: Secondary | ICD-10-CM

## 2019-07-07 DIAGNOSIS — R1032 Left lower quadrant pain: Secondary | ICD-10-CM

## 2019-07-07 MED ORDER — CYCLOBENZAPRINE HCL 5 MG PO TABS: 5.0000 mg | ORAL_TABLET | Freq: Every evening | ORAL | 0 refills | Status: DC | PRN

## 2019-07-07 MED ORDER — NAPROXEN 500 MG PO TABS: 500.0000 mg | ORAL_TABLET | Freq: Two times a day (BID) | ORAL | 0 refills | Status: DC

## 2019-07-07 NOTE — ED Triage Notes (Signed)
Pt lifts heavy objects for job; states started with left groin pain radiating into LLQ and left testicle over past couple weeks, with progressive worsening.  Denies any localized area of bulging.

## 2019-07-07 NOTE — ED Provider Notes (Signed)
[place]   MRN: [id number] DOB: 09/17/1990  Subjective:   Jimmy Lucas is a 28 y.o. male presenting for 2-week history of progressively worsening left-sided groin and lower abdominal pain.  Patient states that he works for [employer], does a lot of heavy lifting upwards of 100 pounds, a lot of strenuous work activities.  Denies any masses, dysuria, hematuria, history of renal stone, fevers, nausea, vomiting, bloody stools or constipation.  Patient is not taking any medications for relief.  His primary concern was to make sure he did not have a hernia and also wanted to ask for a note for his work.  Denies taking chronic medications.  No Known Allergies  Past Medical History:  Diagnosis Date  . History of staph infection      History reviewed. No pertinent surgical history.  Family History  Problem Relation Age of Onset  . Chronic bronchitis Mother   . Hypertension Mother   . Healthy Father     Social History   Tobacco Use  . Smoking status: Never Smoker  . Smokeless tobacco: Never Used  Substance Use Topics  . Alcohol use: Not Currently  . Drug use: Yes    Types: Marijuana    Review of Systems  Constitutional: Negative for fever and malaise/fatigue.  HENT: Negative for congestion, ear pain, sinus pain and sore throat.   Eyes: Negative for discharge and redness.  Respiratory: Negative for cough, hemoptysis, shortness of breath and wheezing.   Cardiovascular: Negative for chest pain.  Gastrointestinal: Positive for abdominal pain. Negative for blood in stool, constipation, diarrhea, nausea and vomiting.  Genitourinary: Negative for dysuria, flank pain and hematuria.  Musculoskeletal: Negative for myalgias.  Skin: Negative for rash.  Neurological: Negative for dizziness, weakness and headaches.  Psychiatric/Behavioral: Negative for depression and substance abuse.     Objective:   Vitals: BP 138/82   Pulse 63   Temp 97.9 F (36.6 C) (Oral)   Resp 16    SpO2 100%   Physical Exam Constitutional:      General: He is not in acute distress.    Appearance: Normal appearance. He is well-developed. He is not ill-appearing, toxic-appearing or diaphoretic.  HENT:     Head: Normocephalic and atraumatic.     Right Ear: External ear normal.     Left Ear: External ear normal.     Nose: Nose normal.     Mouth/Throat:     Mouth: Mucous membranes are moist.     Pharynx: Oropharynx is clear.  Eyes:     General: No scleral icterus.    Extraocular Movements: Extraocular movements intact.     Pupils: Pupils are equal, round, and reactive to light.  Cardiovascular:     Rate and Rhythm: Normal rate and regular rhythm.     Heart sounds: Normal heart sounds. No murmur. No friction rub. No gallop.   Pulmonary:     Effort: Pulmonary effort is normal. No respiratory distress.     Breath sounds: Normal breath sounds. No stridor. No wheezing, rhonchi or rales.  Abdominal:     General: Bowel sounds are normal. There is no distension.     Palpations: Abdomen is soft. There is no mass.     Tenderness: There is abdominal tenderness (Over area outlined, worse with bearing down). There is no guarding or rebound.     Hernia: No hernia is present.    Skin:    General: Skin is warm and dry.  Neurological:  Mental Status: He is alert and oriented to person, place, and time.  Psychiatric:        Mood and Affect: Mood normal.        Behavior: Behavior normal.        Thought Content: Thought content normal.      Assessment and Plan :   1. Groin pain, left   2. Strain of groin, left, initial encounter     No hernia was appreciated on exam.  Counseled patient on what I suspect is a groin strain.  Will use naproxen, muscle relaxant and work restrictions to help resolve this.  Provided patient with information to [facility] in the event that patient has no improvement.  Counseled that they will be able to provide patient with a consult to rule out hernia if his  symptoms persist.  Follow-up with occupational health as well for clearance to return to work should this be necessary. Counseled patient on potential for adverse effects with medications prescribed/recommended today, ER and return-to-clinic precautions discussed, patient verbalized understanding.    Wallis Bamberg, [place] 07/07/19 1432

## 2021-04-08 ENCOUNTER — Other Ambulatory Visit: Payer: Self-pay

## 2021-04-08 ENCOUNTER — Ambulatory Visit (HOSPITAL_COMMUNITY)
Admission: EM | Admit: 2021-04-08 | Discharge: 2021-04-08 | Disposition: A | Discharge status: Home / Self Care | Payer: Self-pay | Attending: Physician Assistant | Admitting: Physician Assistant

## 2021-04-08 ENCOUNTER — Encounter (HOSPITAL_COMMUNITY): Payer: Self-pay

## 2021-04-08 DIAGNOSIS — S61211A Laceration without foreign body of left index finger without damage to nail, initial encounter: Secondary | ICD-10-CM

## 2021-04-08 DIAGNOSIS — Z23 Encounter for immunization: Secondary | ICD-10-CM

## 2021-04-08 MED ORDER — TETANUS-DIPHTH-ACELL PERTUSSIS 5-2.5-18.5 LF-MCG/0.5 IM SUSY
0.5000 mL | PREFILLED_SYRINGE | Freq: Once | INTRAMUSCULAR | Status: AC
  Administered 2021-04-08: 0.5 mL via INTRAMUSCULAR

## 2021-04-08 MED ORDER — TETANUS-DIPHTH-ACELL PERTUSSIS 5-2.5-18.5 LF-MCG/0.5 IM SUSY
PREFILLED_SYRINGE | INTRAMUSCULAR | Status: AC
  Filled 2021-04-08: qty 0.5

## 2021-04-08 NOTE — ED Provider Notes (Signed)
[facility]    CSN: [id number] Arrival date & time: 04/08/21  1324      History   Chief Complaint Chief Complaint  Patient presents with   Laceration    HPI Jimmy Lucas is a 30 y.o. male.   Patient here today for evaluation of laceration to his left index finger.  He states he accidentally cut the tip of his finger at work today.  He did have bleeding initially but this is resolved.  He has some minimal numbness to the very tip of his finger but otherwise no numbness or tingling.  He has not had any fever or chills.  He cannot recall his last tetanus vaccination.   Laceration Associated symptoms: no fever    Past Medical History:  Diagnosis Date   History of staph infection     There are no problems to display for this patient.   History reviewed. No pertinent surgical history.     Home Medications    Prior to Admission medications   Medication Sig Start Date End Date Taking? Authorizing Provider  cyclobenzaprine (FLEXERIL) 5 MG tablet Take 1 tablet (5 mg total) by mouth at bedtime as needed for muscle spasms. 07/07/19   Wallis Bamberg, PA-C  naproxen (NAPROSYN) 500 MG tablet Take 1 tablet (500 mg total) by mouth 2 (two) times daily. 07/07/19   Wallis Bamberg, PA-C    Family History Family History  Problem Relation Age of Onset   Chronic bronchitis Mother    Hypertension Mother    Healthy Father     Social History Social History   Tobacco Use   Smoking status: Never   Smokeless tobacco: Never  Vaping Use   Vaping Use: Never used  Substance Use Topics   Alcohol use: Not Currently   Drug use: Yes    Frequency: 4.0 times per week    Types: Marijuana     Allergies   Patient has no known allergies.   Review of Systems Review of Systems  Constitutional:  Negative for chills and fever.  Eyes:  Negative for discharge and redness.  Respiratory:  Negative for shortness of breath.   Skin:  Positive for wound. Negative for color change.     Physical Exam Triage Vital Signs ED Triage Vitals  Enc Vitals Group     BP 04/08/21 1512 129/85     Pulse Rate 04/08/21 1512 (!) 54     Resp 04/08/21 1512 16     Temp 04/08/21 1512 97.9 F (36.6 C)     Temp Source 04/08/21 1512 Oral     SpO2 04/08/21 1512 100 %     Weight --      Height --      Head Circumference --      Peak Flow --      Pain Score 04/08/21 1510 0     Pain Loc --      Pain Edu? --      Excl. in GC? --    No data found.  Updated Vital Signs BP 129/85 (BP Location: Left Arm)   Pulse (!) 54   Temp 97.9 F (36.6 C) (Oral)   Resp 16   SpO2 100%      Physical Exam Vitals and nursing note reviewed.  Constitutional:      General: He is not in acute distress.    Appearance: Normal appearance. He is not ill-appearing.  HENT:     Head: Normocephalic.  Eyes:  Conjunctiva/sclera: Conjunctivae normal.  Cardiovascular:     Rate and Rhythm: Normal rate.  Pulmonary:     Effort: Pulmonary effort is normal.  Skin:    Comments: Approximately 1 and half centimeter superficial laceration to distal tip of left index finger without active bleeding or drainage.  No surrounding erythema or swelling.  Neurological:     Mental Status: He is alert.  Psychiatric:        Mood and Affect: Mood normal.        Behavior: Behavior normal.     UC Treatments / Results  Labs (all labs ordered are listed, but only abnormal results are displayed) Labs Reviewed - No data to display  EKG   Radiology No results found.  Procedures Procedures (including critical care time)  Medications Ordered in UC Medications  Tdap (BOOSTRIX) injection 0.5 mL (has no administration in time range)    Initial Impression / Assessment and Plan / UC Course  I have reviewed the triage vital signs and the nursing notes.  Pertinent labs & imaging results that were available during my care of the patient were reviewed by me and considered in my medical decision making (see chart for  details).  Given superficial laceration with no active bleeding recommended he just continue to keep covered with bandage versus liquid Band-Aid.  Tetanus vaccination administered in office today.  Encouraged follow-up with any further concerns or signs of infection.  Final Clinical Impressions(s) / UC Diagnoses   Final diagnoses:  Laceration of left index finger without foreign body without damage to nail, initial encounter     Discharge Instructions      Keep wound clean and covered at work. Follow up with any further concerns.      ED Prescriptions   None    PDMP not reviewed this encounter.   Tomi Bamberger, PA-C 04/08/21 1527

## 2021-04-08 NOTE — Discharge Instructions (Addendum)
Keep wound clean and covered at work. Follow up with any further concerns.

## 2021-04-08 NOTE — ED Triage Notes (Signed)
Pt presents with laceration in the tip of left index. Reports he cut the left index finger around 5 am today with metal razors at work.   Pt requested Tdap.
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
## 2023-01-22 ENCOUNTER — Emergency Department (HOSPITAL_COMMUNITY)
Admission: EM | Admit: 2023-01-22 | Discharge: 2023-01-22 | Disposition: A | Discharge status: Home / Self Care | Payer: BC Managed Care – PPO | Attending: Emergency Medicine | Admitting: Emergency Medicine

## 2023-01-22 ENCOUNTER — Emergency Department (HOSPITAL_COMMUNITY): Payer: BC Managed Care – PPO

## 2023-01-22 ENCOUNTER — Emergency Department (HOSPITAL_COMMUNITY): Payer: Self-pay

## 2023-01-22 ENCOUNTER — Encounter (HOSPITAL_COMMUNITY): Payer: Self-pay | Admitting: *Deleted

## 2023-01-22 ENCOUNTER — Other Ambulatory Visit: Payer: Self-pay

## 2023-01-22 DIAGNOSIS — R0789 Other chest pain: Secondary | ICD-10-CM | POA: Diagnosis present

## 2023-01-22 DIAGNOSIS — Y92410 Unspecified street and highway as the place of occurrence of the external cause: Secondary | ICD-10-CM | POA: Insufficient documentation

## 2023-01-22 DIAGNOSIS — R519 Headache, unspecified: Secondary | ICD-10-CM | POA: Insufficient documentation

## 2023-01-22 MED ORDER — ACETAMINOPHEN 500 MG PO TABS
1000.0000 mg | ORAL_TABLET | Freq: Once | ORAL | Status: AC
  Administered 2023-01-22: 1000 mg via ORAL
  Filled 2023-01-22: qty 2

## 2023-01-22 MED ORDER — METHOCARBAMOL 500 MG PO TABS: 500.0000 mg | ORAL_TABLET | Freq: Two times a day (BID) | ORAL | 0 refills | Status: DC
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
## 2023-01-22 NOTE — Discharge Instructions (Addendum)

## 2023-08-10 ENCOUNTER — Emergency Department (HOSPITAL_COMMUNITY)
Admission: EM | Admit: 2023-08-10 | Discharge: 2023-08-10 | Discharge status: Against Medical Advice | Payer: Self-pay | Attending: Emergency Medicine | Admitting: Emergency Medicine

## 2023-08-10 DIAGNOSIS — R413 Other amnesia: Secondary | ICD-10-CM | POA: Insufficient documentation

## 2023-08-10 DIAGNOSIS — Z5321 Procedure and treatment not carried out due to patient leaving prior to being seen by health care provider: Secondary | ICD-10-CM | POA: Insufficient documentation

## 2023-08-10 DIAGNOSIS — H53149 Visual discomfort, unspecified: Secondary | ICD-10-CM | POA: Insufficient documentation

## 2023-09-04 ENCOUNTER — Ambulatory Visit (HOSPITAL_COMMUNITY)
Admission: EM | Admit: 2023-09-04 | Discharge: 2023-09-04 | Discharge status: Against Medical Advice | Payer: No Payment, Other
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
## 2024-02-26 ENCOUNTER — Emergency Department (HOSPITAL_COMMUNITY)
Admission: EM | Admit: 2024-02-26 | Discharge: 2024-02-28 | Payer: Self-pay | Attending: Emergency Medicine | Admitting: Emergency Medicine

## 2024-02-26 DIAGNOSIS — F29 Unspecified psychosis not due to a substance or known physiological condition: Secondary | ICD-10-CM | POA: Insufficient documentation

## 2024-02-26 LAB — COMPREHENSIVE METABOLIC PANEL WITH GFR
ALT: 16 U/L (ref 0–44)
AST: 20 U/L (ref 15–41)
Albumin: 4.8 g/dL (ref 3.5–5.0)
Alkaline Phosphatase: 97 U/L (ref 38–126)
Anion gap: 12 (ref 5–15)
BUN: 7 mg/dL (ref 6–20)
CO2: 23 mmol/L (ref 22–32)
Calcium: 10 mg/dL (ref 8.9–10.3)
Chloride: 103 mmol/L (ref 98–111)
Creatinine, Ser: 0.96 mg/dL (ref 0.61–1.24)
GFR, Estimated: >60 mL/min (ref >60)
Glucose, Bld: 124 mg/dL — ABNORMAL HIGH (ref 70–99)
Potassium: 4.1 mmol/L (ref 3.5–5.1)
Sodium: 138 mmol/L (ref 135–145)
Total Bilirubin: 0.7 mg/dL (ref 0.0–1.2)
Total Protein: 7.6 g/dL (ref 6.5–8.1)

## 2024-02-26 LAB — CBC WITH DIFFERENTIAL/PLATELET
Abs Immature Granulocytes: 0.01 K/uL (ref 0.00–0.07)
Basophils Absolute: 0 K/uL (ref 0.0–0.1)
Basophils Relative: 1 %
Eosinophils Absolute: 0 K/uL (ref 0.0–0.5)
Eosinophils Relative: 0 %
HCT: 45.8 % (ref 39.0–52.0)
Hemoglobin: 15.3 g/dL (ref 13.0–17.0)
Immature Granulocytes: 0 %
Lymphocytes Relative: 23 %
Lymphs Abs: 1.3 K/uL (ref 0.7–4.0)
MCH: 27 pg (ref 26.0–34.0)
MCHC: 33.4 g/dL (ref 30.0–36.0)
MCV: 80.8 fL (ref 80.0–100.0)
Monocytes Absolute: 0.5 K/uL (ref 0.1–1.0)
Monocytes Relative: 8 %
Neutro Abs: 3.8 K/uL (ref 1.7–7.7)
Neutrophils Relative %: 68 %
Platelets: 246 K/uL (ref 150–400)
RBC: 5.67 MIL/uL (ref 4.22–5.81)
RDW: 13.2 % (ref 11.5–15.5)
WBC: 5.6 K/uL (ref 4.0–10.5)
nRBC: 0 % (ref 0.0–0.2)

## 2024-02-26 LAB — ETHANOL: Alcohol, Ethyl (B): <15 mg/dL (ref <15)

## 2024-02-26 LAB — RAPID URINE DRUG SCREEN, HOSP PERFORMED
Amphetamines: NOT DETECTED
Barbiturates: NOT DETECTED
Benzodiazepines: NOT DETECTED
Cocaine: NOT DETECTED
Opiates: NOT DETECTED
Tetrahydrocannabinol: POSITIVE — AB

## 2024-02-26 NOTE — ED Provider Notes (Signed)
 [place] EMERGENCY DEPARTMENT AT [facility] Provider Note   CSN: [id number] Arrival date & time: 02/26/24  2044     Patient presents with: Psychiatric Evaluation   Jimmy Lucas is a 33 y.o. male.   Overall patient brought in by police with hallucinations.  Patient found walking in the street with several guns in a [place].  He stated to them that he had been hearing voices was receiving text messages from someone who stated they needed his help.  But then he cannot remember who it was.  He has been withdrawn.  He was saying that he needed to hurt people.  He denies any alcohol or drug use with me.  He denies any specific suicidal homicidal ideation with me.  He is very withdrawn.  He states that he has been going through it.  He states he lost his job several months ago.  Denies any mental health history.  The history is provided by the patient and the police.       Prior to Admission medications   Medication Sig Start Date End Date Taking? Authorizing Provider  methocarbamol  (ROBAXIN ) 500 MG tablet Take 1 tablet (500 mg total) by mouth 2 (two) times daily. 01/22/23   Prosperi, Christian H, PA-C    Allergies: Patient has no known allergies.    Review of Systems  Updated Vital Signs BP 132/85 (BP Location: Right Arm)   Pulse 100   Temp 98.3 F (36.8 C) (Oral)   Resp 18   SpO2 100%   Physical Exam Vitals and nursing note reviewed.  Constitutional:      General: He is not in acute distress.    Appearance: He is well-developed. He is not ill-appearing.  HENT:     Head: Normocephalic and atraumatic.     Nose: Nose normal.     Mouth/Throat:     Mouth: Mucous membranes are moist.  Eyes:     Extraocular Movements: Extraocular movements intact.     Conjunctiva/sclera: Conjunctivae normal.     Pupils: Pupils are equal, round, and reactive to light.  Cardiovascular:     Rate and Rhythm: Normal rate and regular rhythm.     Pulses: Normal pulses.     Heart  sounds: Normal heart sounds. No murmur heard. Pulmonary:     Effort: Pulmonary effort is normal. No respiratory distress.     Breath sounds: Normal breath sounds.  Abdominal:     General: Abdomen is flat.     Palpations: Abdomen is soft.     Tenderness: There is no abdominal tenderness.  Musculoskeletal:        General: No swelling.     Cervical back: Normal range of motion and neck supple.  Skin:    General: Skin is warm and dry.     Capillary Refill: Capillary refill takes less than 2 seconds.  Neurological:     General: No focal deficit present.     Mental Status: He is alert and oriented to person, place, and time.     Cranial Nerves: No cranial nerve deficit.     Sensory: No sensory deficit.     Motor: No weakness.     Coordination: Coordination normal.  Psychiatric:     Comments: He denies SI HI, he is very withdrawn, not sure if he is responding to internal stimuli but he is cooperative for questioning     (all labs ordered are listed, but only abnormal results are displayed) Labs Reviewed  COMPREHENSIVE  METABOLIC PANEL WITH GFR - Abnormal; Notable for the following components:      Result Value   Glucose, Bld 124 (*)    All other components within normal limits  RAPID URINE DRUG SCREEN, HOSP PERFORMED - Abnormal; Notable for the following components:   Tetrahydrocannabinol POSITIVE (*)    All other components within normal limits  ETHANOL  CBC WITH DIFFERENTIAL/PLATELET    EKG: EKG Interpretation Date/Time:  Monday February 26 2024 21:02:37 EDT Ventricular Rate:  93 PR Interval:  150 QRS Duration:  82 QT Interval:  328 QTC Calculation: 407 R Axis:   77  Text Interpretation: Normal sinus rhythm with sinus arrhythmia Minimal voltage criteria for LVH, may be normal variant ( Cornell product ) Borderline ECG When compared with ECG of 01-Jan-2014 21:39, PREVIOUS ECG IS PRESENT Confirmed by Ruthe Cornet [phone]) on 02/26/2024 9:05:19 PM  Radiology: No results  found.   Procedures   Medications Ordered in the ED - No data to display                                  Medical Decision Making Amount and/or Complexity of Data Reviewed Labs: ordered.   Jimmy Lucas is here with concern for homicidal/mental health evaluation knee.  Patient brought in by police as concern for his mental health.  He was found wandering the streets with guns and a [place].  Patient does not really remember what happened today.  He told me that he was hearing voices and seeing things.  He denies any alcohol or drug use.  He states he recently lost his job.  Denies any mental health history.  He is withdrawn on exam.  Police states that they were able to confiscate his guns but he was in front of a house with several kids and seemed to be responding to internal stimuli.  He somewhat seems to may be responding to internal stimuli on exam with me but he is calm and cooperative.  Overall story is concerning for may be acute psychosis.  IVC was filled out.  Basic labs were obtained which were unremarkable.  He was positive for marijuana.  I do not see any major medical history.  Overall we will have him evaluated by psychiatry given concerning story.  This chart was dictated using voice recognition software.  Despite best efforts to proofread,  errors can occur which can change the documentation meaning.      Final diagnoses:  Psychosis, unspecified psychosis type [facility])    ED Discharge Orders     None          Ruthe Cornet, DO 02/26/24 2237

## 2024-02-26 NOTE — ED Notes (Signed)
 Pt clothing in purple locker #6. Pt's phone, wallet, and keys are with security.

## 2024-02-26 NOTE — ED Notes (Signed)
 IVC: CASE #: [id number] ENVELOPE: [id number] ORIGINAL IN RED FOLDER COPY IN MR DRAWER 3 COPIES ON CLIPBOARD IN ORANGE
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
## 2024-02-26 NOTE — ED Notes (Signed)
 Psych folder with primary RN .

## 2024-02-27 DIAGNOSIS — F29 Unspecified psychosis not due to a substance or known physiological condition: Secondary | ICD-10-CM | POA: Insufficient documentation

## 2024-02-27 LAB — SARS CORONAVIRUS 2 BY RT PCR: SARS Coronavirus 2 by RT PCR: NEGATIVE

## 2024-02-27 MED ORDER — OLANZAPINE 5 MG PO TABS: 5.0000 mg | ORAL_TABLET | Freq: Two times a day (BID) | ORAL | Status: DC

## 2024-02-27 MED ORDER — ZIPRASIDONE MESYLATE 20 MG IM SOLR
20.0000 mg | Freq: Once | INTRAMUSCULAR | Status: AC | PRN
Start: 2024-02-27 — End: 2024-02-27
  Administered 2024-02-27 (×2): 20 mg via INTRAMUSCULAR
  Filled 2024-02-27: qty 20

## 2024-02-27 NOTE — ED Notes (Signed)
 Out to use phone, alert, NAD, calm, interactive, pleasant. Steady gait.

## 2024-02-27 NOTE — Progress Notes (Signed)
 Pt has been accepted to [facility] on 02/27/2024. Bed assignment: Main   Pt meets inpatient criteria per Richerd Ivans, NP   Attending Physician will be Dr. Prentice Balloon  Report can be called to: [phone]  Pt can arrive asap pending Covid test   Care Team Notified: Dorn Ahle, RN, Elveria Batter, NP

## 2024-02-27 NOTE — ED Provider Notes (Signed)
 Emergency Medicine Observation Re-evaluation Note  Jimmy Lucas is a 33 y.o. male, seen on rounds today.  Pt initially presented to the ED for complaints of delusions, agitated behavior. Received meds last night. Currently calm, resting.   Physical Exam  BP 106/87 (BP Location: Left Arm)   Pulse 82   Temp 97.6 F (36.4 C) (Oral)   Resp 16   SpO2 100%  Physical Exam General: calm, resting.  Cardiac: regular rate. Lungs: breathing comfortably. Psych: resting, calm.   ED Course / MDM    I have reviewed the labs performed to date as well as medications administered while in observation.  Recent changes in the last 24 hours include ED obs, reassessment. SABRA  Plan  BH team is recommending inpatient psychiatric treatment. Placement is pending.   Dispo per [facility] team.     Bernard Drivers, MD 02/27/24 [phone]

## 2024-02-27 NOTE — ED Notes (Signed)
 IVC is current
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
## 2024-02-27 NOTE — ED Notes (Signed)
 Zyprexa  dose offered to pt, he sates  I don't want no medicine, I just want to go home for real

## 2024-02-27 NOTE — ED Notes (Signed)
 Pt was escalating in mood. Yelling in room, pt was requested to relax, security in purple zone for de-escalating measures.

## 2024-02-27 NOTE — ED Notes (Signed)
 IVC papers taken over to purple zone and given to nurse

## 2024-02-27 NOTE — Progress Notes (Signed)
 Inpatient Psychiatric Referral  Patient was recommended inpatient per Richerd Ivans, NP. There are no available beds at [facility], per [facility]. Patient was referred to the following out of network facilities:  Destination  Service Provider Request Status Address Phone Fax  [facility]  Pending - Request Sent [address], [place] [address] [phone]  [facility]  Pending - Request Sent [address] [place] [address] [phone]  [facility]  Pending - Request Sent [address] [place] [address] [phone]  [facility]  Pending - Request Sent [address], [place] [address] [phone]  [facility]  Pending - Request Sent [address] [place] [address] [phone]  [facility]  Pending - Request Sent [address] [place] [address] [phone]  [facility]  Pending - Request Sent [address] [place] [phone]  [facility]  Pending - Request Sent [address] [place] [address] [phone]    Situation ongoing, CSW to continue following and update chart as more information becomes available.   Harrie Sofia MSW, LCSWA 02/27/2024  9:31AM

## 2024-02-27 NOTE — ED Notes (Signed)
 Pt currently in shower.

## 2024-02-27 NOTE — ED Notes (Addendum)
 Pt put the shower

## 2024-02-27 NOTE — ED Notes (Signed)
 Pt was approached in his room and asked why he was yelling and who he was yelling to. Pt apologized speaking softly, stating  are you a woman of god? I was speaking to god, I'm sorry. Stating god is telling me to say that the man that was yelling was the devil speaking pt states he will not yell out again and that he is praying out loud speaking with god. Pt informed to lower voice. Pt still speaking with raised volume at times.

## 2024-02-27 NOTE — ED Notes (Signed)
 Ambulatory, steady gait, up to b/r and phone.

## 2024-02-27 NOTE — ED Notes (Signed)
 EDP into see

## 2024-02-27 NOTE — BH Assessment (Signed)
 Comprehensive Clinical Assessment (CCA) Note   02/27/2024 Jimmy Lucas [id number]  Disposition: Richerd Ivans, NP recommends inpatient hospitalization.   The patient demonstrates the following risk factors for suicide: Chronic risk factors for suicide include: psychiatric disorder of Psychosis, unspecified psychosis type (HCC) . Acute risk factors for suicide include: social withdrawal/isolation. Protective factors for this patient include: positive social support. Considering these factors, the overall suicide risk at this point appears to be low. Patient is not appropriate for outpatient follow up.    Per EDP's note: Patient brought in by police with hallucinations.  Patient found walking in the street with several guns in a [place].  He stated to them that he had been hearing voices was receiving text messages from someone who stated they needed his help.  But then he cannot remember who it was.  He has been withdrawn.  He was saying that he needed to hurt people.  He denies any alcohol or drug use with me.  He denies any specific suicidal homicidal ideation with me.  He is very withdrawn.  He states that he has been going through it.  He states he lost his job several months ago.  Denies any mental health history.  Upon evaluation with this clinician, the patient is alert, oriented x 3, and cooperative. Pt was a poor historian. Speech is slow. Pt appears casual. Eye contact is fair. Mood is anxious and depressed; affect is congruent with mood. Pt presented paranoid. Pt denies SI/HI/AVH. There is no indication that the patient is responding to internal stimuli. No delusions elicited during this assessment.        Chief Complaint:  Chief Complaint  Patient presents with   Psychiatric Evaluation   Visit Diagnosis: Psychosis, unspecified psychosis type (HCC)    CCA Screening, Triage and Referral (STR)  Patient Reported Information How did you hear about us ? -- [facility] ED)  What Is the  Reason for Your Visit/Call Today? Per EDP's note: Patient brought in by police with hallucinations.  Patient found walking in the street with several guns in a [place].  He stated to them that he had been hearing voices was receiving text messages from someone who stated they needed his help.  But then he cannot remember who it was.  He has been withdrawn.  He was saying that he needed to hurt people.  He denies any alcohol or drug use with me.  He denies any specific suicidal homicidal ideation with me.  He is very withdrawn.  He states that he has been going through it.  He states he lost his job several months ago.  Denies any mental health history.  How Long Has This Been Causing You Problems? > than 6 months  What Do You Feel Would Help You the Most Today? Treatment for Depression or other mood problem; Stress Management; Medication(s)   Have You Recently Had Any Thoughts About Hurting Yourself? No  Are You Planning to Commit Suicide/Harm Yourself At This time? No   Flowsheet Row ED from [facility]/26/2024 in [facility] at [facility] ED from [facility]/04/2023 in [facility] ED from 01/22/2023 in [facility] at [facility]  C-SSRS RISK CATEGORY No Risk No Risk No Risk    Have you Recently Had Thoughts About Hurting Someone [place]? No  Are You Planning to Harm Someone at This Time? No  Explanation: Denies HI   Have You Used Any Alcohol or Drugs in the Past  24 Hours? No  How Long Ago Did You Use Drugs or Alcohol? N/A What Did You Use and How Much? [email or web address]   Do You Currently Have a Therapist/Psychiatrist? No  Name of Therapist/Psychiatrist:  n/a  Have You Been Recently Discharged From Any Office Practice or Programs? No  Explanation of Discharge From Practice/Program: n/a    CCA Screening Triage Referral Assessment Type of Contact: Tele-Assessment  Telemedicine Service Delivery: Telemedicine service  delivery: This service was provided via telemedicine using a 2-way, interactive audio and video technology  Is this Initial or Reassessment? Is this Initial or Reassessment?: Initial Assessment  Date Telepsych consult ordered in [facility]:  Date Telepsych consult ordered in [facility]: 02/26/24  Time Telepsych consult ordered in [facility]:  Time Telepsych consult ordered in [facility]: 2050  Location of Assessment: [facility] ED  Provider Location: [facility] Assessment Services   Collateral Involvement: n/a   Does Patient Have a Automotive engineer Guardian? No  Legal Guardian Contact Information: n/a  Copy of Legal Guardianship Form: -- (n/a)  Legal Guardian Notified of Arrival: -- (n/a)  Legal Guardian Notified of Pending Discharge: -- (n/a)  If Minor and Not Living with Parent(s), Who has Custody? n/a  Is CPS involved or ever been involved? Never  Is APS involved or ever been involved? Never   Patient Determined To Be At Risk for Harm To Self or Others Based on Review of Patient Reported Information or Presenting Complaint? No  Method: No Plan  Availability of Means: No access or NA  Intent: Vague intent or NA  Notification Required: No need or identified person  Additional Information for Danger to Others Potential: -- (n/a)  Additional Comments for Danger to Others Potential: Pt presents paranoid  Are There Guns or Other Weapons in Your Home? Yes  Types of Guns/Weapons: Pt was found with two guns on him. Pt unable to tell me the types of guns.  Are These Weapons Safely Secured?                            Yes (No longer has access)  Who Could Verify You Are Able To Have These Secured: n/a  Do You Have any Outstanding Charges, Pending Court Dates, Parole/Probation? Pt denies pending legal charges  Contacted To Inform of Risk of Harm To Self or Others: -- (n/a)    Does Patient Present under Involuntary Commitment? Yes    [place] of Residence: [place]   Patient Currently Receiving  the Following Services: Not Receiving Services   Determination of Need: Urgent (48 hours)   Options For Referral: Inpatient Hospitalization     CCA Biopsychosocial Patient Reported Schizophrenia/Schizoaffective Diagnosis in Past: No   Strengths: n/a   Mental Health Symptoms Depression:  None   Duration of Depressive symptoms:    Mania:  None   Anxiety:   Restlessness; Worrying   Psychosis:  Delusions; Hallucinations   Duration of Psychotic symptoms: Duration of Psychotic Symptoms: Greater than six months   Trauma:  None   Obsessions:  None   Compulsions:  None   Inattention:  None   Hyperactivity/Impulsivity:  None   Oppositional/Defiant Behaviors:  None   Emotional Irregularity:  None   Other Mood/Personality Symptoms:  n/a    Mental Status Exam Appearance and self-care  Stature:  Average   Weight:  Average weight   Clothing:  Casual   Grooming:  Normal   Cosmetic use:  None   Posture/gait:  Normal  Motor activity:  Not Remarkable   Sensorium  Attention:  Inattentive; Confused   Concentration:  Scattered   Orientation:  X5   Recall/memory:  Defective in Recent; Defective in Remote   Affect and Mood  Affect:  Anxious; Depressed; Flat   Mood:  Anxious   Relating  Eye contact:  Normal   Facial expression:  Responsive   Attitude toward examiner:  Guarded   Thought and Language  Speech flow: Clear and Coherent   Thought content:  Appropriate to Mood and Circumstances   Preoccupation:  None   Hallucinations:  None   Organization:  Disorganized   Company secretary of Knowledge:  Fair   Intelligence:  Average   Abstraction:  Normal   Judgement:  Poor   Reality Testing:  Unaware   Insight:  Lacking   Decision Making:  Impulsive   Social Functioning  Social Maturity:  Impulsive   Social Judgement:  Heedless   Stress  Stressors:  Relationship   Coping Ability:  Overwhelmed; Exhausted   Skill  Deficits:  Communication; Decision making   Supports:  Support needed     Religion: Religion/Spirituality Are You A Religious Person?: No How Might This Affect Treatment?: n/a  Leisure/Recreation: Leisure / Recreation Do You Have Hobbies?: No  Exercise/Diet: Exercise/Diet Do You Exercise?: No Have You Gained or Lost A Significant Amount of Weight in the Past Six Months?: No Do You Follow a Special Diet?: No Do You Have Any Trouble Sleeping?: No   CCA Employment/Education Employment/Work Situation: Employment / Work Situation Employment Situation: Unemployed Patient's Job has Been Impacted by Current Illness: No Has Patient ever Been in Equities trader?: No  Education: Education Is Patient Currently Attending School?: No Last Grade Completed: 12 Did You Product manager?: No Did You Have An Individualized Education Program (IIEP): No Did You Have Any Difficulty At [employer]?: No Patient's Education Has Been Impacted by Current Illness: No   CCA Family/Childhood History Family and Relationship History: Family history Marital status: Single Does patient have children?: No  Childhood History:  Childhood History By whom was/is the patient raised?: Mother Did patient suffer any verbal/emotional/physical/sexual abuse as a child?: No Did patient suffer from severe childhood neglect?: No Has patient ever been sexually abused/assaulted/raped as an adolescent or adult?: No Was the patient ever a victim of a crime or a disaster?: No Witnessed domestic violence?: No Has patient been affected by domestic violence as an adult?: No       CCA Substance Use Alcohol/Drug Use: Alcohol / Drug Use Pain Medications: SEE MAR Prescriptions: SEE MAR Over the Counter: SEE MAR History of alcohol / drug use?: No history of alcohol / drug abuse Longest period of sobriety (when/how long): Pt denies etoh/ drug use Negative Consequences of Use:  (n/a) Withdrawal Symptoms: None (n/a)                          ASAM's:  Six Dimensions of Multidimensional Assessment  Dimension 1:  Acute Intoxication and/or Withdrawal Potential:      Dimension 2:  Biomedical Conditions and Complications:      Dimension 3:  Emotional, Behavioral, or Cognitive Conditions and Complications:     Dimension 4:  Readiness to Change:     Dimension 5:  Relapse, Continued use, or Continued Problem Potential:     Dimension 6:  Recovery/Living Environment:     ASAM Severity Score:    ASAM Recommended Level of Treatment: ASAM Recommended  Level of Treatment:  (n/a)   Substance use Disorder (SUD) Substance Use Disorder (SUD)  Checklist Symptoms of Substance Use:  (n/a)  Recommendations for Services/Supports/Treatments: Recommendations for Services/Supports/Treatments Recommendations For Services/Supports/Treatments: Inpatient Hospitalization  Disposition Recommendation per psychiatric provider: We recommend inpatient psychiatric hospitalization after medical hospitalization. Patient has been involuntarily committed on 02/27/24.    DSM5 Diagnoses: There are no active problems to display for this patient.    Referrals to Alternative Service(s): Referred to Alternative Service(s):   Place:   Date:   Time:    Referred to Alternative Service(s):   Place:   Date:   Time:    Referred to Alternative Service(s):   Place:   Date:   Time:    Referred to Alternative Service(s):   Place:   Date:   Time:     Rosina PARAS, [place], [facility]

## 2024-02-27 NOTE — ED Notes (Signed)
 Pt in room speaking to himself. Had an outburst episode of yelling crying out per the sitter, unintelligible. Pt currently yelling its going to be the end of the motherfucking world pt is yelling in his room from what sounds like prayers and crying out begging for help to no one in the room.

## 2024-02-27 NOTE — ED Notes (Signed)
 Security requested to stay in Purple zone for safety support. Pt continues to talk and cry out to no one in the room
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
## 2024-02-27 NOTE — ED Notes (Signed)
 Provisionally accepted to [facility]. They will contact care coordinator. Pt will need Covid test prior to definitive acceptance.

## 2024-02-28 NOTE — ED Provider Notes (Signed)
 Emergency Medicine Observation Re-evaluation Note  Jimmy Lucas is a 33 y.o. male, seen on rounds today.  Pt initially presented to the ED for complaints of Psychiatric Evaluation Currently, the patient is resting; no complaints   Physical Exam  BP 120/82 (BP Location: Left Arm)   Pulse 66   Temp 97.6 F (36.4 C) (Oral)   Resp 18   SpO2 100%  Physical Exam General: NAD Cardiac: RR Lungs: non labored  Psych: calm   ED Course / MDM  EKG:EKG Interpretation Date/Time:  Monday February 26 2024 21:02:37 EDT Ventricular Rate:  93 PR Interval:  150 QRS Duration:  82 QT Interval:  328 QTC Calculation: 407 R Axis:   77  Text Interpretation: Normal sinus rhythm with sinus arrhythmia Minimal voltage criteria for LVH, may be normal variant ( Cornell product ) Borderline ECG When compared with ECG of 01-Jan-2014 21:39, PREVIOUS ECG IS PRESENT Confirmed by Ruthe Cornet [phone]) on 02/26/2024 9:05:19 PM  I have reviewed the labs performed to date as well as medications administered while in observation.  Recent changes in the last 24 hours include accepted to [facility] regional.  Plan  Current plan is for inpatient psych;    Neysa Caron PARAS, DO 02/28/24 [phone]

## 2024-02-28 NOTE — ED Notes (Signed)
 Case Number: [id number] IVC is current and 3 copies verified.  Case number added to IVC documents IVC expires 03/04/2024

## 2024-02-28 NOTE — ED Notes (Signed)
 IVC'd 02/26/24, exp 03/04/24

## 2024-02-28 NOTE — ED Notes (Signed)
 Staffing called, no available sitter for pt.
